# Patient Record
Sex: Female | Born: 2000 | Race: White | Hispanic: No | Marital: Single | Smoking: Never smoker
Health system: Southern US, Community
[De-identification: ages and names within clinical notes are randomized; demographics above are authoritative.]

---
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
## 2021-08-22 IMAGING — CT CT ABD-PELV W/ CM
2 of 4 series · 17 of 46 positions shown, 19 images · IV contrast (omnipaque)
Comparison: None.

CLINICAL DATA: Right lower quadrant abdominal pain

EXAM:
CT ABDOMEN AND PELVIS WITH CONTRAST
TECHNIQUE: Multidetector CT imaging of the abdomen and pelvis was performed
using the standard protocol following bolus administration of
intravenous contrast.
CONTRAST:  100mL OMNIPAQUE IOHEXOL 300 MG/ML  SOLN

[Series 2: axial st · axial · 0.63mm/px · z∈[-494,-94]mm · 14 of 88 slices shown, 16 images]
[im 4/88  soft-tissue]
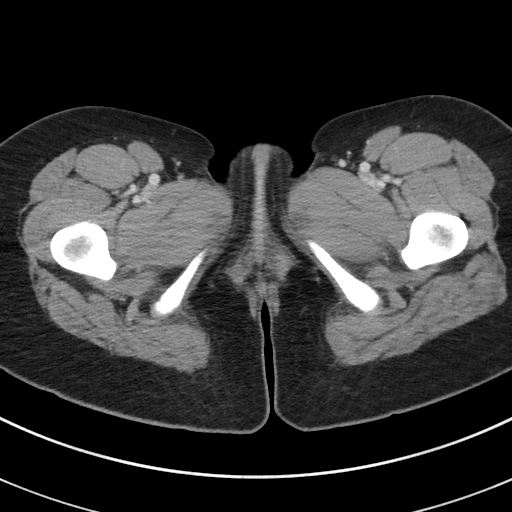
[im 4/88  bone]
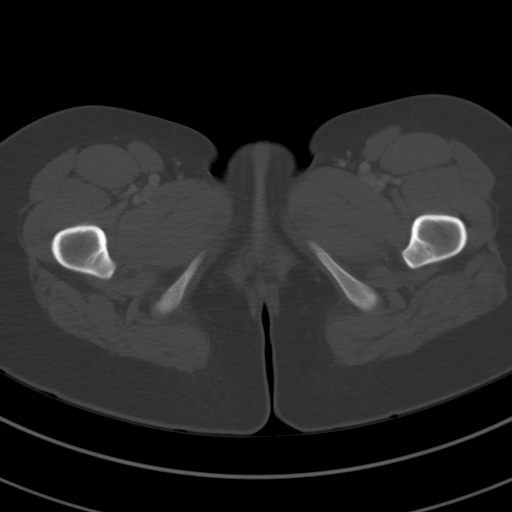
[im 11/88  soft-tissue]
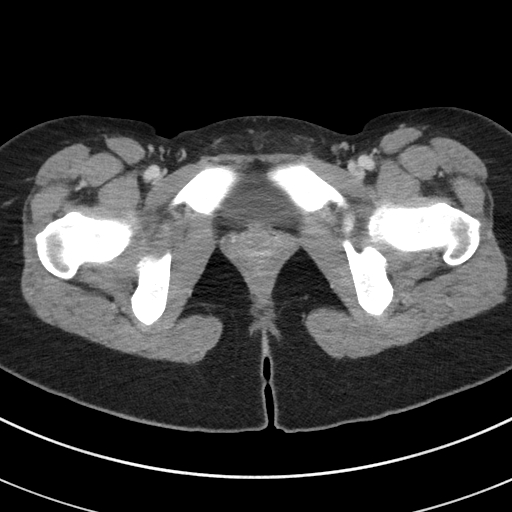
[im 18/88  soft-tissue]
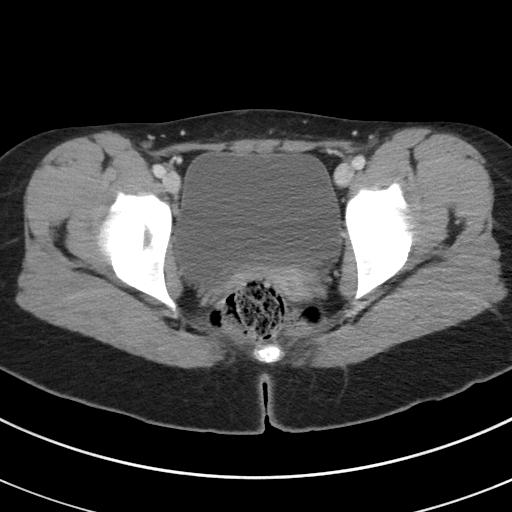
[im 25/88  soft-tissue]
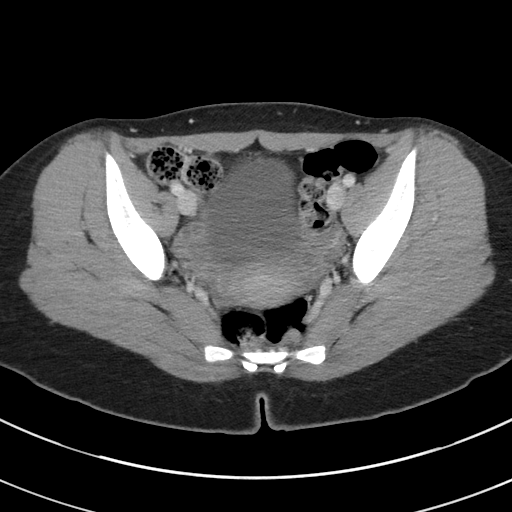
[im 28/88  soft-tissue]
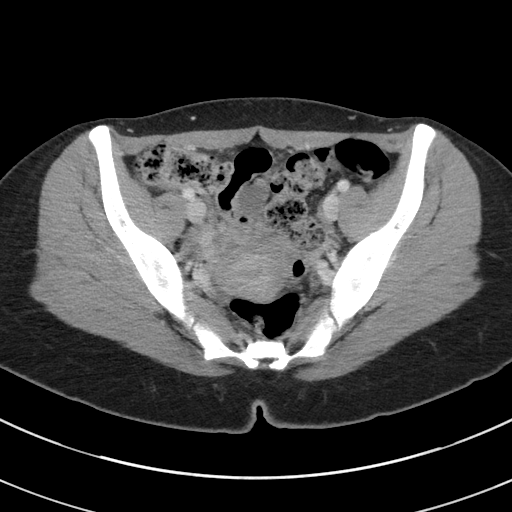
[im 35/88  soft-tissue]
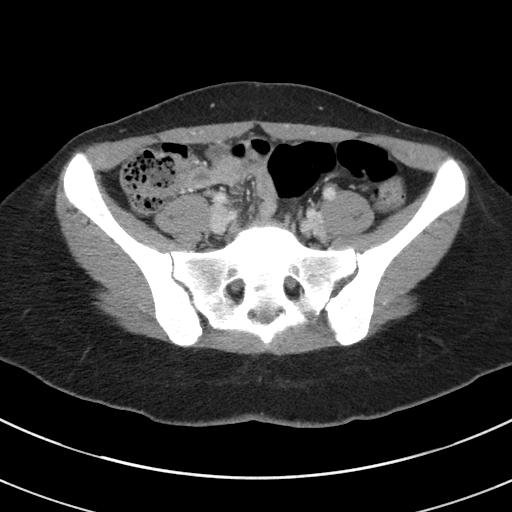
[im 42/88  soft-tissue]
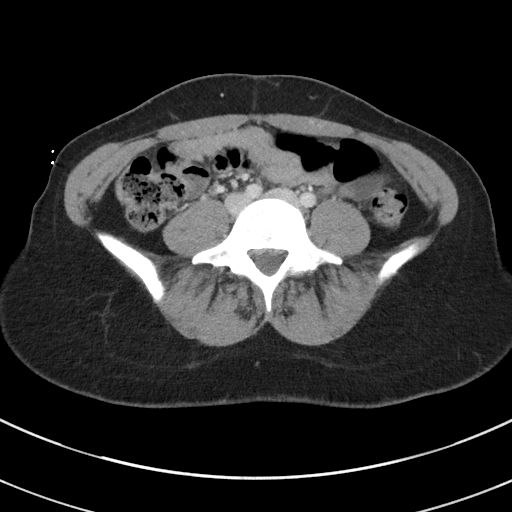
[im 46/88  soft-tissue]
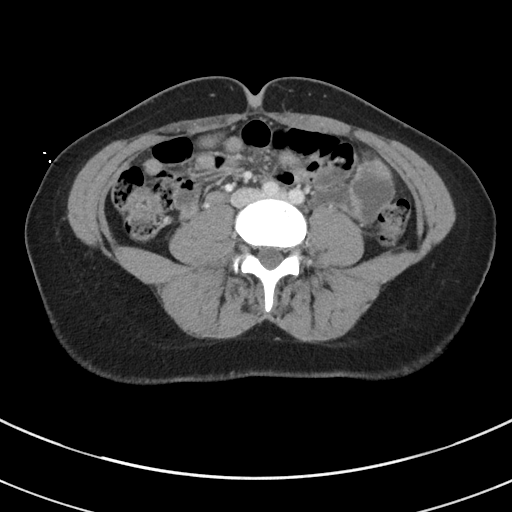
[im 53/88  soft-tissue]
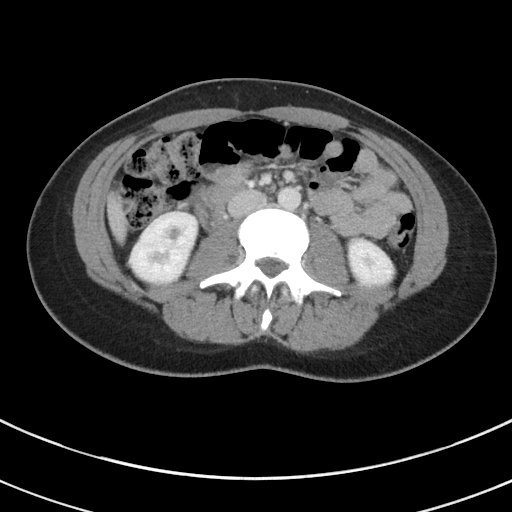
[im 53/88  bone]
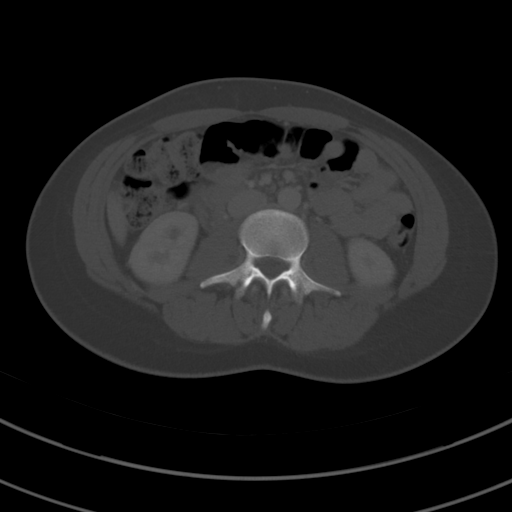
[im 60/88  soft-tissue]
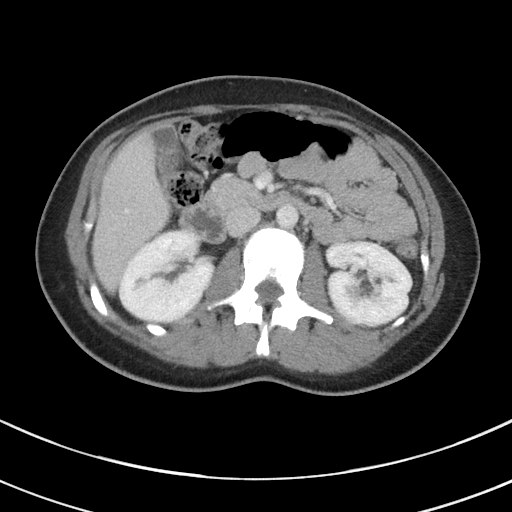
[im 67/88  soft-tissue]
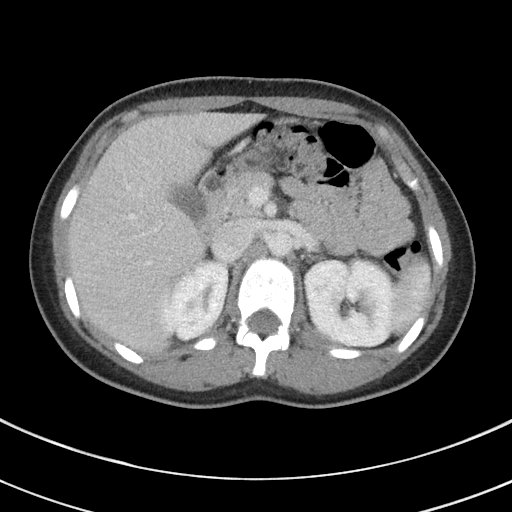
[im 70/88  soft-tissue]
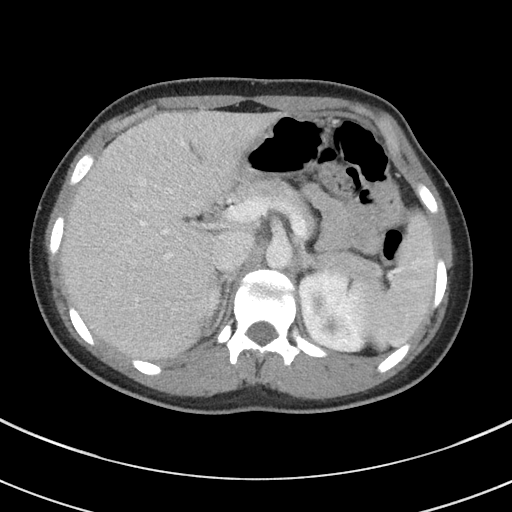
[im 77/88  soft-tissue]
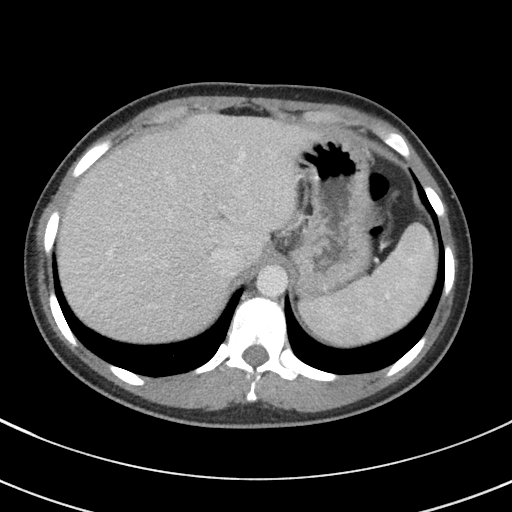
[im 84/88  soft-tissue]
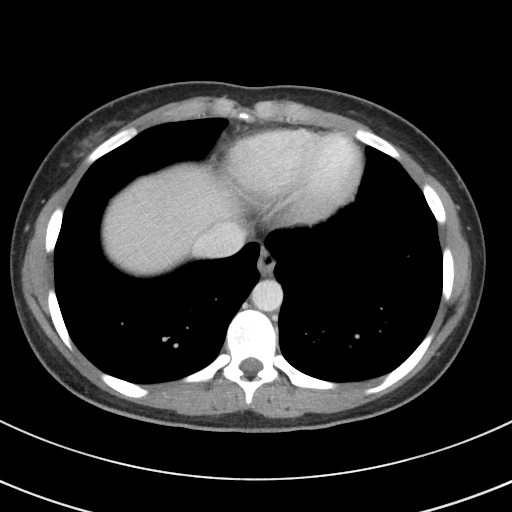

[Series 5: coronal st · coronal · 0.75mm/px · 3 of 72 slices shown]
[im 24/72  soft-tissue]
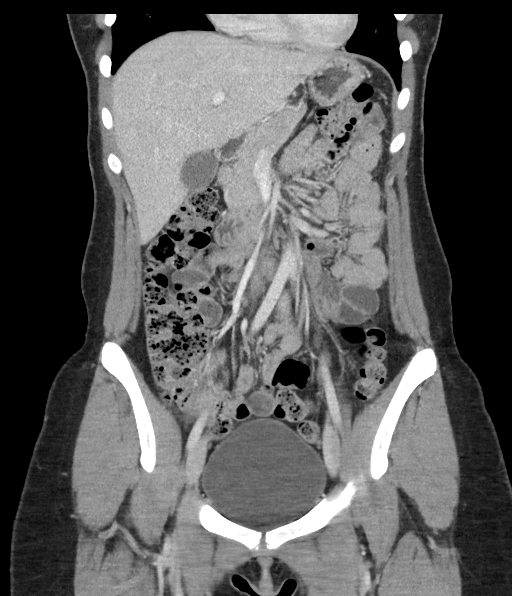
[im 32/72  soft-tissue]
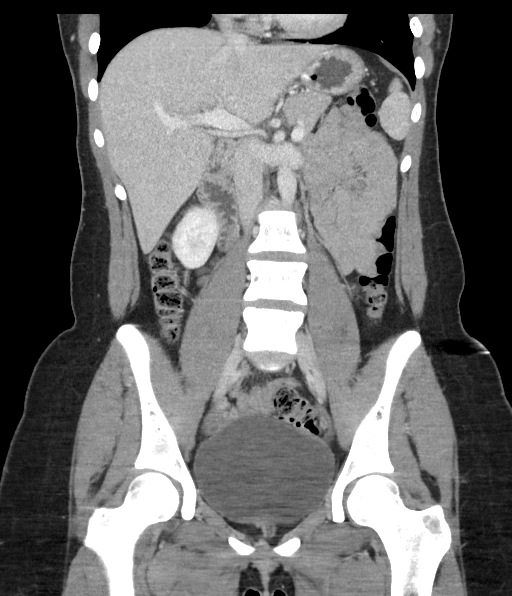
[im 40/72  soft-tissue]
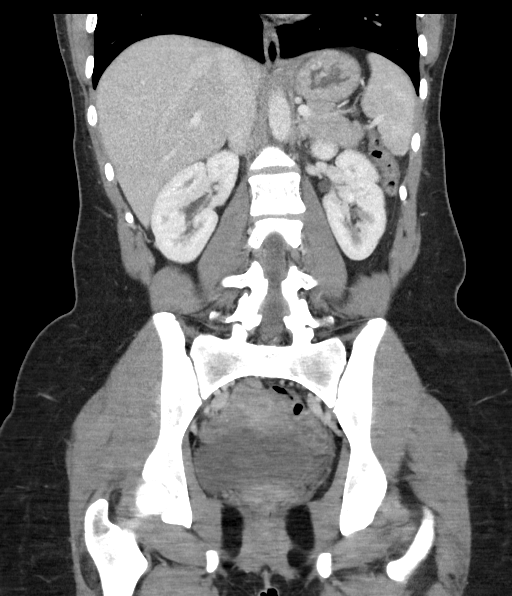

[17 of 46 positions shown; findings below may reference images not displayed]

FINDINGS: Lower chest: No acute abnormality.

Hepatobiliary: No focal liver abnormality is seen. No gallstones,
gallbladder wall thickening, or biliary dilatation.

Pancreas: Unremarkable. No pancreatic ductal dilatation or
surrounding inflammatory changes.

Spleen: Normal in size without focal abnormality.

Adrenals/Urinary Tract: Adrenal glands are unremarkable. Kidneys are
normal, without renal calculi, focal lesion, or hydronephrosis.
Bladder is unremarkable.

Stomach/Bowel: Stomach is within normal limits. Appendix appears
normal. No evidence of bowel wall thickening, distention, or
inflammatory changes.

Vascular/Lymphatic: No significant vascular findings are present. No
enlarged abdominal or pelvic lymph nodes.

Reproductive: Uterus and bilateral adnexa are unremarkable.

Other: Rectum unremarkable

Musculoskeletal: No acute or significant osseous findings.
IMPRESSION: Normal appendix. Unremarkable examination. No radiographic
explanation for the patient's reported symptoms.

## 2022-05-18 ENCOUNTER — Encounter: Payer: Self-pay | Admitting: Medical

## 2022-05-18 ENCOUNTER — Other Ambulatory Visit: Payer: Self-pay

## 2022-05-18 ENCOUNTER — Ambulatory Visit (INDEPENDENT_AMBULATORY_CARE_PROVIDER_SITE_OTHER): Payer: 59 | Admitting: Medical

## 2022-05-18 VITALS — BP 118/72 | HR 115 | Temp 99.5°F | Ht 64.57 in | Wt 200.0 lb

## 2022-05-18 DIAGNOSIS — J029 Acute pharyngitis, unspecified: Secondary | ICD-10-CM | POA: Diagnosis not present

## 2022-05-18 LAB — POCT RAPID STREP A (OFFICE): Rapid Strep A Screen: NEGATIVE

## 2022-05-18 LAB — POCT INFLUENZA A/B
Influenza A, POC: NEGATIVE
Influenza B, POC: NEGATIVE

## 2022-05-18 NOTE — Progress Notes (Signed)
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  Sx began 2 nights ago with sore throat. Noted additional symptoms yesterday, nasal congestion and runny nose. Sore throat slightly worse, some painful to swallow.  No cough, HA or myalgias. No sweats or chills. Has not really suspected fever. No significant fatigue. No nausea, vomiting or diarrhea.  Denies sick contacts. Has not had flu shot. Thinks she has had a recent COVID vaccine. Had negative at home COVID-19 antigen test this morning. No hx of mono.  Nyquil last night, no meds yet today.   Tends to have strep throat about once a year. Has shared drinks recently.    Current Medication:  Outpatient Encounter Medications as of 05/18/2022  Medication Sig   [DISCONTINUED] naproxen (NAPROSYN) 500 MG tablet Take 1 tablet (500 mg total) by mouth 2 (two) times daily. (Patient not taking: Reported on 05/18/2022)   No facility-administered encounter medications on file as of 05/18/2022.      Medical History: History reviewed. No pertinent past medical history.   Vital Signs: BP 118/72   Pulse (!) 115   Temp 99.5 F (37.5 C) (Tympanic)   Ht 5' 4.57" (1.64 m)   Wt 150 lb (68 kg)   SpO2 97%   BMI 25.30 kg/m    Review of Systems  Constitutional:  Negative for chills, fatigue and fever.  HENT:  Positive for congestion, rhinorrhea, sore throat and trouble swallowing (mild pain).   Respiratory:  Negative for cough and shortness of breath.   Gastrointestinal:  Negative for diarrhea, nausea and vomiting.  Musculoskeletal:  Negative for myalgias and neck stiffness.  Neurological:  Negative  for headaches.    Physical Exam Vitals reviewed.  Constitutional:      General: She is not in acute distress.    Comments: Tired appearing  HENT:     Head: Normocephalic.     Right Ear: Ear canal and external ear normal.     Left Ear: Ear canal and external ear normal.     Ears:     Comments: TMs slightly dull    Nose: Mucosal edema, congestion and rhinorrhea (cloudy) present.     Mouth/Throat:     Mouth: Mucous membranes are moist. No oral lesions.     Pharynx: Posterior oropharyngeal erythema (mild) present. No pharyngeal swelling.     Tonsils: Tonsillar exudate (few patches of white exudate to tonsils and pharynx.) present. 1+ on the right. 1+ on the left.  Cardiovascular:     Rate and Rhythm: Regular rhythm. Tachycardia present.     Heart sounds: No murmur heard.    No friction rub. No gallop.  Pulmonary:     Effort: Pulmonary effort is normal.     Breath sounds: Normal breath sounds. No wheezing, rhonchi or rales.  Musculoskeletal:     Cervical back: Neck supple. No rigidity.  Lymphadenopathy:     Cervical: Cervical adenopathy (small anterior nodes, slightly tender) present.  Neurological:     Mental Status: She is alert.    Results for orders placed or performed in visit on 05/18/22 (from the past 24 hour(s))  POCT rapid strep A  Status: Normal   Collection Time: 05/18/22  2:31 PM  Result Value Ref Range   Rapid Strep A Screen Negative Negative  POCT Influenza A/B     Status: Normal   Collection Time: 05/18/22  2:31 PM  Result Value Ref Range   Influenza A, POC Negative Negative   Influenza B, POC Negative Negative      Assessment/Plan: 1. Pharyngitis, unspecified etiology POC flu and strep A testing negative. Viral versus bacterial. Will send throat swab for strep culture. Discussed supportive and symptomatic treatment.  - POCT rapid strep A - POCT Influenza A/B - Culture, Group A Strep  Patient Instructions:  -Take an over-the-counter meds (such as  ibuprofen, Dayquil) as needed for symptom relief. -Rest and drink plenty of water.  Drinking warm or cold liquids (such as tea, soup or smoothies) and eating soft foods (such as oatmeal) may be more comfortable until your throat pain improves.   -Do not share cups/water bottles/ utensils with others.  Wash your hands or use hand sanitizer often, especially before/after eating and after coughing into your hand or blowing your nose.    -You will receive a MyChart message notifying you of your lab results when they are available.  -Send MyChart message to provider or schedule return appointment in meantime as needed for new/worsening symptoms (such as increased throat pain or difficulty swallowing, high fever).  General Counseling: Pansy verbalizes understanding of the findings of todays visit and agrees with plan of treatment. she has been encouraged to call the office with any questions or concerns that should arise related to todays visit.   Orders Placed This Encounter  Procedures   POCT rapid strep A   POCT Influenza A/B    No orders of the defined types were placed in this encounter.   Time spent: [place] PA-C [place] 05/18/2022 2:05 PM

## 2022-05-18 NOTE — Patient Instructions (Signed)
-  Take an over-the-counter meds (such as ibuprofen, Dayquil) as needed for symptom relief. -Rest and drink plenty of water.  Drinking warm or cold liquids (such as tea, soup or smoothies) and eating soft foods (such as oatmeal) may be more comfortable until your throat pain improves.   -Do not share cups/water bottles/ utensils with others.  Wash your hands or use hand sanitizer often, especially before/after eating and after coughing into your hand or blowing your nose.    -You will receive a MyChart message notifying you of your lab results when they are available.  -Send MyChart message to provider or schedule return appointment in meantime as needed for new/worsening symptoms (such as increased throat pain or difficulty swallowing, high fever).

## 2022-05-21 DIAGNOSIS — J028 Acute pharyngitis due to other specified organisms: Secondary | ICD-10-CM

## 2022-05-21 LAB — CULTURE, GROUP A STREP

## 2022-05-21 MED ORDER — AZITHROMYCIN 250 MG PO TABS: ORAL_TABLET | ORAL | 0 refills | Status: AC

## 2022-06-30 ENCOUNTER — Ambulatory Visit (INDEPENDENT_AMBULATORY_CARE_PROVIDER_SITE_OTHER): Payer: 59 | Admitting: Medical

## 2022-06-30 ENCOUNTER — Other Ambulatory Visit: Payer: Self-pay

## 2022-06-30 ENCOUNTER — Encounter: Payer: Self-pay | Admitting: Medical

## 2022-06-30 VITALS — BP 120/80 | HR 121 | Temp 98.4°F | Ht 64.57 in | Wt 202.0 lb

## 2022-06-30 DIAGNOSIS — H9203 Otalgia, bilateral: Secondary | ICD-10-CM | POA: Diagnosis not present

## 2022-06-30 DIAGNOSIS — R070 Pain in throat: Secondary | ICD-10-CM | POA: Diagnosis not present

## 2022-06-30 NOTE — Progress Notes (Signed)
[address] [place], [place] [address] Phone: [phone] Fax: [phone]   Office Visit Note  Patient Name: Christina Hughes  Date of [id number]  Med Rec number [id number]  Date of Service: 06/30/2022  Allergies: Penicillins and Blue dyes (parenteral)  Chief Complaint  Patient presents with   Sore Throat     HPI 21 y.o. college student presents with ear pain and painful swallowing.  Noted some left ear pain 5 days ago. Helped with Tylenol but pain returns. Feels like dull pressure. Doesn't feel congested or have difficulty hearing. No fever or chills. R ear also bothersome since yesterday. Last took analgesic 8 hrs ago.   Was vomiting over weekend. Had fingers in throat at one point, thinks she may have scratched tonsil with nails. Has had pain to left throat since then (3d ago). Doing salt water gargles and tea, ice cubes, some helpful. No nasal congestion or cough. No fever or chills.  Tends to get an ear infection about once a year. Denies hx of allergies.    Current Medication:  No outpatient encounter medications on file as of 06/30/2022.   No facility-administered encounter medications on file as of 06/30/2022.      Medical History: History reviewed. No pertinent past medical history.   Vital Signs: BP 120/80   Pulse (!) 121   Temp 98.4 F (36.9 C) (Tympanic)   Ht 5' 4.57" (1.64 m)   Wt 202 lb (91.6 kg)   SpO2 98%   BMI 34.07 kg/m    Review of Systems See HPI  Physical Exam Vitals reviewed.  Constitutional:      General: She is not in acute distress.    Appearance: She is not ill-appearing.  HENT:     Head: Normocephalic.     Right Ear: Ear canal and external ear normal. No middle ear effusion. Tympanic membrane is not perforated, erythematous or bulging.     Left Ear: Ear canal and external ear normal.  No middle ear effusion. Tympanic membrane is not perforated, erythematous or bulging.     Ears:     Comments: TMs  dull bilaterally, no significant middle ear fluid.    Nose: Mucosal edema (mild) present. No congestion or rhinorrhea.     Mouth/Throat:     Mouth: Mucous membranes are moist. No oral lesions.     Pharynx: No pharyngeal swelling or posterior oropharyngeal erythema.     Tonsils: No tonsillar exudate. 1+ on the right. 1+ on the left.  Cardiovascular:     Rate and Rhythm: Regular rhythm. Tachycardia present.     Heart sounds: No murmur heard.    No friction rub. No gallop.  Pulmonary:     Effort: Pulmonary effort is normal.     Breath sounds: Normal breath sounds. No wheezing, rhonchi or rales.  Musculoskeletal:     Cervical back: Neck supple. No rigidity.  Lymphadenopathy:     Cervical: No cervical adenopathy.  Neurological:     Mental Status: She is alert.     Assessment/Plan: 1. Otalgia of both ears Suspect eustachian tube dysfunction. Recommended over-the-counter decongestant and Ibuprofen as needed. Also encouraged Flonase nasal spray, 2 sprays to each nostril once a day.  2. Throat discomfort No significant findings on exam to explain throat pain. No findings suggestive of infection. Encouraged to continue supportive and symptomatic treatment.   For both concerns, patient advised to send MyChart message to provider or schedule return visit as needed for new/worsening symptoms (i.e.  fever, increasing pain) or if symptoms do not improve as discussed with recommended treatments over next 5-7 days.     General Counseling: Christina Hughes verbalizes understanding of the findings of todays visit and agrees with plan of treatment. she has been encouraged to call the office with any questions or concerns that should arise related to todays visit.   Time spent:15 [place] PA-C [place] 06/30/2022 4:48 PM

## 2022-07-04 ENCOUNTER — Encounter: Payer: Self-pay | Admitting: Medical

## 2022-07-04 NOTE — Patient Instructions (Addendum)
-  Try Flonase/Fluticasone nasal spray, 2 sprays to each nostril once a day. -Take over-the-counter decongestant (i.e. Sudafed) and Ibuprofen as needed for ear pain/discomfort.  -Rest and stay well hydrated (by drinking water and other liquids).  -For your throat discomfort, use cough drops/throat lozenges, gargle warm salt water and/or drink warm liquids (like tea with honey). Avoid spicy or acidic food.  -Send MyChart message to provider or schedule return visit as needed for new/worsening symptoms (i.e. fever, increasing pain) or if symptoms do not improve as discussed with recommended treatment over next 5-7 days.

## 2023-02-28 ENCOUNTER — Other Ambulatory Visit: Payer: Self-pay

## 2023-02-28 ENCOUNTER — Ambulatory Visit (INDEPENDENT_AMBULATORY_CARE_PROVIDER_SITE_OTHER): Payer: 59 | Admitting: Medical

## 2023-02-28 ENCOUNTER — Encounter: Payer: Self-pay | Admitting: Medical

## 2023-02-28 VITALS — BP 118/68 | HR 124 | Temp 99.0°F

## 2023-02-28 DIAGNOSIS — S93401A Sprain of unspecified ligament of right ankle, initial encounter: Secondary | ICD-10-CM | POA: Diagnosis not present

## 2023-02-28 DIAGNOSIS — J069 Acute upper respiratory infection, unspecified: Secondary | ICD-10-CM

## 2023-02-28 NOTE — Progress Notes (Signed)
[facility] [address] [place], [place] [address] Phone: [phone] Fax: [phone]   Office Visit Note  Patient Name: Christina Hughes  Date of [id number]  Med Rec number [id number]  Date of Service: 02/28/2023  Allergies: Penicillins and Blue dyes (parenteral)  Chief Complaint  Patient presents with   Acute Visit     HPI 22 y.o. college student presents with cough and ankle injury.  Developed cough yesterday AM. Also mild sore throat, thinks might be due to coughing. Mild nasal congestion. Cough is some productive of mucus, little yellow. Denies rhinorrhea or nasal d/c. Little chilled overnight last night, no sweats. No HA or myalgias. Regurgitated a little due to coughing. Little nausea she attributes to coughing. Not unusually fatigued.   No hx of asthma, does not smoke or vape.  Nyquil last night, no meds yet today.   Injured right ankle (rolled it laterally) 3 days ago. Has noted swelling and pain. Has been elevating, resting, wearing a wrap and icing. Feels some better today.    Current Medication:  No outpatient encounter medications on file as of 02/28/2023.   No facility-administered encounter medications on file as of 02/28/2023.      Medical History: History reviewed. No pertinent past medical history.   Vital Signs: BP 118/68   Pulse (!) 124   Temp 99 F (37.2 C) (Tympanic)   SpO2 98%    Review of Systems See HPI  Physical Exam Vitals reviewed.  Constitutional:      General: She is not in acute distress.    Appearance: She is not ill-appearing.     Comments: Tired appearing  HENT:     Head: Normocephalic.     Right Ear: Ear canal and external ear normal.     Left Ear: Ear canal and external ear normal.     Ears:     Comments: TMs dull bilaterally    Nose: Mucosal edema and congestion present. No rhinorrhea.     Right Sinus: No maxillary sinus tenderness or frontal sinus tenderness.     Left Sinus: No maxillary sinus  tenderness or frontal sinus tenderness.     Mouth/Throat:     Mouth: Mucous membranes are moist. No oral lesions.     Pharynx: Uvula midline. Posterior oropharyngeal erythema (mild) present. No pharyngeal swelling or uvula swelling.     Tonsils: No tonsillar exudate. 1+ on the right. 1+ on the left.  Cardiovascular:     Rate and Rhythm: Regular rhythm. Tachycardia present.     Heart sounds: No murmur heard.    No friction rub. No gallop.  Pulmonary:     Effort: Pulmonary effort is normal.     Breath sounds: Normal breath sounds. No wheezing, rhonchi or rales.  Musculoskeletal:     Cervical back: Neck supple. No rigidity.     Right ankle: Swelling (mild-moderate around lateral malleolus) present. No deformity or ecchymosis. Tenderness present over the lateral malleolus (slight), ATF ligament and posterior TF ligament. No medial malleolus, base of 5th metatarsal or proximal fibula tenderness. Normal range of motion.     Comments: Bearing significant weight on affected ankle. Minimal limp.  Lymphadenopathy:     Cervical: Cervical adenopathy (small anterior nodes, nontender) present.  Neurological:     Mental Status: She is alert.     Assessment/Plan: 1. Acute upper respiratory infection Suspect viral infection. Discussed supportive measures and OTC symptomatic treatment.  2. Sprain of right ankle, unspecified ligament, initial encounter Exam and history  consistent with sprain, No suspected fracture.  Patient encouraged to continue wrapping ankle at home. Keep elevated when able and rest/stay off foot when possible.  No running/jumping or similar activities until pain/swelling resolve.  May take over-the-counter ibuprofen as needed for pain.  Return to clinic as needed for new/worsening symptoms or if pain/swelling do not gradually resolve over the next 7-10 days.      General Counseling: Annesley verbalizes understanding of the findings of todays visit and agrees with plan of treatment.  she has been encouraged to call the office with any questions or concerns that should arise related to todays visit.    Time spent:20 Minutes    Jonathon Resides PA-C [facility] 02/28/2023 3:10 PM

## 2023-03-06 ENCOUNTER — Ambulatory Visit (INDEPENDENT_AMBULATORY_CARE_PROVIDER_SITE_OTHER): Payer: 59 | Admitting: Physician Assistant

## 2023-03-06 ENCOUNTER — Encounter: Payer: Self-pay | Admitting: Physician Assistant

## 2023-03-06 VITALS — BP 128/70 | HR 120 | Temp 98.5°F | Ht 64.0 in | Wt 210.0 lb

## 2023-03-06 DIAGNOSIS — J22 Unspecified acute lower respiratory infection: Secondary | ICD-10-CM

## 2023-03-06 DIAGNOSIS — J3489 Other specified disorders of nose and nasal sinuses: Secondary | ICD-10-CM | POA: Diagnosis not present

## 2023-03-06 MED ORDER — MOMETASONE FUROATE 50 MCG/ACT NA SUSP: 2.0000 | Freq: Every day | NASAL | 12 refills | Status: AC

## 2023-03-06 MED ORDER — PREDNISONE 10 MG PO TABS: ORAL_TABLET | ORAL | 0 refills | Status: AC

## 2023-03-06 MED ORDER — AZITHROMYCIN 250 MG PO TABS: ORAL_TABLET | ORAL | 0 refills | Status: AC

## 2023-03-06 NOTE — Progress Notes (Signed)
[facility] [address] [place], [place] [address] Phone: [phone] Fax: [phone]   Office Visit Note  Patient Name: Christina Hughes  Date of [id number]  Med Rec number [id number]   Penicillins and Blue dyes (parenteral)  Chief Complaint  Patient presents with   Follow-up    Cough on monday   Ear Pain   Nasal Congestion   22 year old female presents today with cough and ear pain.  Has had a cough for a week - refer to note from Vira Browns, PA-C on 02/28/23.  Worsening sx since this visit (and also worsening exam as below when compared with last visit).  Every day, states she can wake up and feels crackling   Sputum from cough - green and yellow   Emesis after cough fits  Ears feel full   Nasal congestion, yellow  Taking Dayquil and Nyquil   No hx of asthma, does not smoke or vape.   Ankle feels better  Oxygen saturation today 95%  Current Medication:  Outpatient Encounter Medications as of 03/06/2023  Medication Sig   azithromycin (ZITHROMAX) 250 MG tablet Take 2 tablets on day 1, then 1 tablet daily on days 2 through 5   mometasone (NASONEX) 50 MCG/ACT nasal spray Place 2 sprays into the nose daily.   predniSONE (DELTASONE) 10 MG tablet Take six tablets on day 1, five tablets on day 2, four tablets on day 3, three tablets on day 4, two tablets on day 5, and one tablet on day 6.   No facility-administered encounter medications on file as of 03/06/2023.      Medical History: No past medical history on file.   Vital Signs: BP 128/70   Pulse (!) 120   Temp 98.5 F (36.9 C)   Ht 5\' 4"  (1.626 m)   Wt 210 lb (95.3 kg)   SpO2 95%   BMI 36.05 kg/m    ROS negative unless otherwise indicated above.  Physical Exam Vitals reviewed.  Constitutional:      Appearance: She is ill-appearing.  HENT:     Right Ear: Ear canal and external ear normal. No laceration, drainage, swelling or tenderness. There is no impacted cerumen. No  foreign body. Tympanic membrane is erythematous. Tympanic membrane is not injected or perforated.     Left Ear: Ear canal and external ear normal. No laceration, drainage, swelling or tenderness. There is no impacted cerumen. No foreign body. Tympanic membrane is erythematous. Tympanic membrane is not injected or perforated.     Nose: Congestion and rhinorrhea present. No mucosal edema.     Right Nostril: No epistaxis.     Left Nostril: No epistaxis.     Right Turbinates: Not enlarged or swollen.     Left Turbinates: Not enlarged or swollen.     Right Sinus: No maxillary sinus tenderness or frontal sinus tenderness.     Left Sinus: No maxillary sinus tenderness or frontal sinus tenderness.     Mouth/Throat:     Pharynx: Posterior oropharyngeal erythema present. No oropharyngeal exudate.     Tonsils: Tonsillar exudate present. No tonsillar abscesses. 1+ on the right. 1+ on the left.  Eyes:     Pupils: Pupils are equal, round, and reactive to light.  Cardiovascular:     Rate and Rhythm: Regular rhythm. Tachycardia present.     Heart sounds: Normal heart sounds. No murmur heard.    No friction rub.  Pulmonary:     Effort: Pulmonary effort is normal. No  respiratory distress.     Breath sounds: No wheezing.     Comments: Reduced breath sounds bilaterally at the bases (RLL/LLL) Not moving air well O2 sats 95% Musculoskeletal:     Cervical back: No tenderness.  Lymphadenopathy:     Cervical: Cervical adenopathy present.  Psychiatric:        Behavior: Behavior normal.     No results found for this or any previous visit (from the past 24 hour(s)).   Assessment/Plan:  1. Lower respiratory infection - azithromycin (ZITHROMAX) 250 MG tablet; Take 2 tablets on day 1, then 1 tablet daily on days 2 through 5  Dispense: 6 tablet; Refill: 0 - predniSONE (DELTASONE) 10 MG tablet; Take six tablets on day 1, five tablets on day 2, four tablets on day 3, three tablets on day 4, two tablets on day  5, and one tablet on day 6.  Dispense: 21 tablet; Refill: 0  2. Nasal discharge - mometasone (NASONEX) 50 MCG/ACT nasal spray; Place 2 sprays into the nose daily.  Dispense: 1 each; Refill: 12    General Counseling: Robin verbalizes understanding of the findings of todays visit and agrees with plan of treatment. I have discussed any further diagnostic evaluation that may be needed or ordered today. We also reviewed her medications today. she has been encouraged to call the office with any questions or concerns that should arise related to todays visit.   No orders of the defined types were placed in this encounter.   Meds ordered this encounter  Medications   azithromycin (ZITHROMAX) 250 MG tablet    Sig: Take 2 tablets on day 1, then 1 tablet daily on days 2 through 5    Dispense:  6 tablet    Refill:  0    Order Specific Question:   Supervising Provider    Answer:   Erasmo Downer [[id number]]   predniSONE (DELTASONE) 10 MG tablet    Sig: Take six tablets on day 1, five tablets on day 2, four tablets on day 3, three tablets on day 4, two tablets on day 5, and one tablet on day 6.    Dispense:  21 tablet    Refill:  0    Order Specific Question:   Supervising Provider    Answer:   Erasmo Downer [[id number]]   mometasone (NASONEX) 50 MCG/ACT nasal spray    Sig: Place 2 sprays into the nose daily.    Dispense:  1 each    Refill:  12    Order Specific Question:   Supervising Provider    Answer:   Erasmo Downer [[id number]]      SignedLennon Alstrom, PA-C 03/06/2023, 5:37 PM

## 2023-03-06 NOTE — Patient Instructions (Addendum)
-  Rest and stay well hydrated (by drinking water and other liquids).  -Take over-the-counter medicines (i.e. Dayquil, Nyquil, Ibuprofen) to help relieve your symptoms. -For your sore throat/cough, use cough drops/throat lozenges, gargle warm salt water and/or drink warm liquids (like tea with honey). -Send MyChart message to provider or schedule return visit as needed for new/worsening symptoms or if symptoms do not improve as discussed with recommended treatment over next 7 days.  Continue wrapping ankle at home. Keep elevated when able and rest/stay off foot when possible.  No running/jumping or similar activities until pain/swelling resolve.  May take over-the-counter ibuprofen as needed for pain.  Return to clinic as needed for new/worsening symptoms or if pain/swelling do not gradually resolve over the next 7-10 days.
